# Patient Record
Sex: Female | Born: 1965 | Race: White | Hispanic: No | Marital: Married | Smoking: Never smoker
Health system: Southern US, Community
[De-identification: ages and names within clinical notes are randomized; demographics above are authoritative.]

## PROBLEM LIST (undated history)

## (undated) DIAGNOSIS — F329 Major depressive disorder, single episode, unspecified: Secondary | ICD-10-CM

## (undated) DIAGNOSIS — F32A Depression, unspecified: Secondary | ICD-10-CM

## (undated) DIAGNOSIS — O139 Gestational [pregnancy-induced] hypertension without significant proteinuria, unspecified trimester: Secondary | ICD-10-CM

## (undated) DIAGNOSIS — E559 Vitamin D deficiency, unspecified: Secondary | ICD-10-CM

## (undated) HISTORY — DX: Depression, unspecified: F32.A

## (undated) HISTORY — PX: TUBAL LIGATION: SHX77

## (undated) HISTORY — DX: Vitamin D deficiency, unspecified: E55.9

## (undated) HISTORY — DX: Gestational (pregnancy-induced) hypertension without significant proteinuria, unspecified trimester: O13.9

## (undated) HISTORY — DX: Major depressive disorder, single episode, unspecified: F32.9

## (undated) HISTORY — PX: APPENDECTOMY: SHX54

---

## 2000-03-25 ENCOUNTER — Encounter: Payer: Self-pay | Admitting: Obstetrics and Gynecology

## 2000-03-25 ENCOUNTER — Ambulatory Visit (HOSPITAL_COMMUNITY): Admission: RE | Admit: 2000-03-25 | Discharge: 2000-03-25 | Payer: Self-pay | Admitting: Obstetrics and Gynecology

## 2000-09-06 ENCOUNTER — Inpatient Hospital Stay (HOSPITAL_COMMUNITY): Admission: AD | Admit: 2000-09-06 | Discharge: 2000-09-10 | Payer: Self-pay | Admitting: Obstetrics and Gynecology

## 2000-09-11 ENCOUNTER — Encounter: Admission: RE | Admit: 2000-09-11 | Discharge: 2000-10-11 | Payer: Self-pay | Admitting: Obstetrics and Gynecology

## 2000-09-12 ENCOUNTER — Observation Stay (HOSPITAL_COMMUNITY): Admission: AD | Admit: 2000-09-12 | Discharge: 2000-09-13 | Payer: Self-pay | Admitting: Obstetrics & Gynecology

## 2000-10-18 ENCOUNTER — Other Ambulatory Visit: Admission: RE | Admit: 2000-10-18 | Discharge: 2000-10-18 | Payer: Self-pay | Admitting: Obstetrics and Gynecology

## 2001-10-11 ENCOUNTER — Other Ambulatory Visit: Admission: RE | Admit: 2001-10-11 | Discharge: 2001-10-11 | Payer: Self-pay | Admitting: Obstetrics and Gynecology

## 2003-05-20 ENCOUNTER — Other Ambulatory Visit: Admission: RE | Admit: 2003-05-20 | Discharge: 2003-05-20 | Payer: Self-pay | Admitting: Obstetrics and Gynecology

## 2004-02-12 ENCOUNTER — Emergency Department (HOSPITAL_COMMUNITY): Admission: EM | Admit: 2004-02-12 | Discharge: 2004-02-12 | Payer: Self-pay | Admitting: Emergency Medicine

## 2004-02-15 ENCOUNTER — Emergency Department (HOSPITAL_COMMUNITY): Admission: EM | Admit: 2004-02-15 | Discharge: 2004-02-15 | Payer: Self-pay | Admitting: Emergency Medicine

## 2004-02-19 ENCOUNTER — Encounter (HOSPITAL_COMMUNITY): Admission: RE | Admit: 2004-02-19 | Discharge: 2004-03-13 | Payer: Self-pay | Admitting: Family Medicine

## 2005-03-05 ENCOUNTER — Other Ambulatory Visit: Admission: RE | Admit: 2005-03-05 | Discharge: 2005-03-05 | Payer: Self-pay | Admitting: Obstetrics and Gynecology

## 2005-03-30 ENCOUNTER — Encounter: Admission: RE | Admit: 2005-03-30 | Discharge: 2005-06-20 | Payer: Self-pay | Admitting: Obstetrics and Gynecology

## 2005-05-24 ENCOUNTER — Ambulatory Visit (HOSPITAL_COMMUNITY): Admission: RE | Admit: 2005-05-24 | Discharge: 2005-05-24 | Payer: Self-pay | Admitting: Obstetrics and Gynecology

## 2005-06-25 ENCOUNTER — Ambulatory Visit (HOSPITAL_COMMUNITY): Admission: RE | Admit: 2005-06-25 | Discharge: 2005-06-25 | Payer: Self-pay | Admitting: Obstetrics and Gynecology

## 2005-11-01 ENCOUNTER — Inpatient Hospital Stay (HOSPITAL_COMMUNITY): Admission: AD | Admit: 2005-11-01 | Discharge: 2005-11-04 | Payer: Self-pay | Admitting: Obstetrics and Gynecology

## 2005-11-05 ENCOUNTER — Encounter: Admission: RE | Admit: 2005-11-05 | Discharge: 2005-12-05 | Payer: Self-pay | Admitting: Obstetrics and Gynecology

## 2005-12-06 ENCOUNTER — Encounter: Admission: RE | Admit: 2005-12-06 | Discharge: 2005-12-31 | Payer: Self-pay | Admitting: Obstetrics and Gynecology

## 2006-03-10 ENCOUNTER — Other Ambulatory Visit: Admission: RE | Admit: 2006-03-10 | Discharge: 2006-03-10 | Payer: Self-pay | Admitting: Obstetrics and Gynecology

## 2006-06-27 ENCOUNTER — Encounter: Admission: RE | Admit: 2006-06-27 | Discharge: 2006-06-27 | Payer: Self-pay | Admitting: Obstetrics and Gynecology

## 2011-01-08 ENCOUNTER — Other Ambulatory Visit: Payer: Self-pay | Admitting: Obstetrics and Gynecology

## 2011-01-08 DIAGNOSIS — Z1231 Encounter for screening mammogram for malignant neoplasm of breast: Secondary | ICD-10-CM

## 2011-01-15 ENCOUNTER — Ambulatory Visit
Admission: RE | Admit: 2011-01-15 | Discharge: 2011-01-15 | Disposition: A | Discharge status: Home / Self Care | Payer: 59 | Source: Ambulatory Visit | Attending: Obstetrics and Gynecology | Admitting: Obstetrics and Gynecology

## 2011-01-15 DIAGNOSIS — Z1231 Encounter for screening mammogram for malignant neoplasm of breast: Secondary | ICD-10-CM

## 2011-02-09 ENCOUNTER — Other Ambulatory Visit: Payer: Self-pay | Admitting: Gastroenterology

## 2011-02-19 ENCOUNTER — Ambulatory Visit
Admission: RE | Admit: 2011-02-19 | Discharge: 2011-02-19 | Disposition: A | Discharge status: Home / Self Care | Payer: 59 | Source: Ambulatory Visit | Attending: Gastroenterology | Admitting: Gastroenterology

## 2011-08-03 ENCOUNTER — Encounter: Payer: Self-pay | Admitting: Family Medicine

## 2011-08-03 ENCOUNTER — Other Ambulatory Visit: Payer: Self-pay | Admitting: Family Medicine

## 2011-08-03 ENCOUNTER — Ambulatory Visit: Payer: 59

## 2011-08-03 ENCOUNTER — Ambulatory Visit (INDEPENDENT_AMBULATORY_CARE_PROVIDER_SITE_OTHER): Payer: 59 | Admitting: Family Medicine

## 2011-08-03 VITALS — BP 122/84 | HR 75 | Temp 97.9°F | Resp 16 | Ht 66.0 in | Wt 246.0 lb

## 2011-08-03 DIAGNOSIS — R0789 Other chest pain: Secondary | ICD-10-CM

## 2011-08-03 DIAGNOSIS — R079 Chest pain, unspecified: Secondary | ICD-10-CM

## 2011-08-03 DIAGNOSIS — E669 Obesity, unspecified: Secondary | ICD-10-CM

## 2011-08-03 LAB — POCT CBC
Granulocyte percent: 60.2 %G (ref 37–80)
Hemoglobin: 12 g/dL — AB (ref 12.2–16.2)
Lymph, poc: 2.1 (ref 0.6–3.4)
MCHC: 31.7 g/dL — AB (ref 31.8–35.4)
MPV: 9.1 fL (ref 0–99.8)
POC Granulocyte: 3.9 (ref 2–6.9)
POC MID %: 7.6 %M (ref 0–12)
RBC: 4.37 M/uL (ref 4.04–5.48)

## 2011-08-03 LAB — COMPREHENSIVE METABOLIC PANEL
ALT: 15 U/L (ref 0–35)
Alkaline Phosphatase: 121 U/L — ABNORMAL HIGH (ref 39–117)
CO2: 27 mEq/L (ref 19–32)
Potassium: 4 mEq/L (ref 3.5–5.3)
Sodium: 140 mEq/L (ref 135–145)
Total Bilirubin: 0.3 mg/dL (ref 0.3–1.2)
Total Protein: 7.1 g/dL (ref 6.0–8.3)

## 2011-08-03 LAB — LIPID PANEL
Cholesterol: 217 mg/dL — ABNORMAL HIGH (ref 0–200)
LDL Cholesterol: 138 mg/dL — ABNORMAL HIGH (ref 0–99)
VLDL: 30 mg/dL (ref 0–40)

## 2011-08-03 NOTE — Progress Notes (Signed)
  Subjective:    Patient ID: Susan Meza, female    DOB: Sep 28, 1965, 46 y.o.   MRN: [id number]  HPI patient comes in with history of having had an episode Saturday night when she was at the movies pain in her low chest anteriorly. It was not a typical pain for her. Had not been eating a thing. She took a Scientist, research (medical) and symptoms seemed to subside they went to supper afterwards. She had more discomfort so she went on home rather than going out to another restaurant for dessert as they had planned. That is not like her. She did take some more TUMS without relief. She did okay on Sunday and Monday. This morning she when she got up she had more symptoms and she decided it was wise to come on and get checked out. She has lately been having some pain in her jaw and some pain through to the back. No nausea or vomiting no diaphoresis. Her bowels act fairly normally.  She does have a history of dysphagia which was treated by esophageal dilatation and has done well for the 6 months or so since then. At that time she was placed on Nexium, but now she only takes it intermittently because she really doesn't feel she needs it.  There is a strong family history of heart disease. Her father and all his side of the family had heart problems, many dying in their 80s. Her father is deceased. Her mother had heart surgery but that was for a valvular problem.  She does not get a lot of regular exercise though she plays tennis about once a week. 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    Review of Systems otherwise unremarkable.     Objective:   Physical Exam overweight white female alert oriented does not look ill. Her throat was clear. Neck supple without nodes. Chest clear to auscultation. Heart regular without murmurs gallops arrhythmias. Abdomen was soft without masses. Minimal epigastric tendernes.  Results for orders placed in visit on 08/03/11  POCT CBC      Component Value Range   WBC 6.4  4.6 -  10.2 (K/uL)   Lymph, poc 2.1  0.6 - 3.4    POC LYMPH PERCENT 32.2  10 - 50 (%L)   MID (cbc) 0.5  0 - 0.9    POC MID % 7.6  0 - 12 (%M)   POC Granulocyte 3.9  2 - 6.9    Granulocyte percent 60.2  37 - 80 (%G)   RBC 4.37  4.04 - 5.48 (M/uL)   Hemoglobin 12.0 (*) 12.2 - 16.2 (g/dL)   HCT, POC 82.9  56.2 - 47.9 (%)   MCV 86.5  80 - 97 (fL)   MCH, POC 27.5  27 - 31.2 (pg)   MCHC 31.7 (*) 31.8 - 35.4 (g/dL)   RDW, POC 13.0     Platelet Count, POC 257  142 - 424 (K/uL)   MPV 9.1  0 - 99.8 (fL)     ekg normal      Assessment & Plan:  Atypical chest pain Family history coronary artery disease History of esophageal stricture, resolved  Will check EKG and chest x-ray and a couple of basic labs and proceed from there.

## 2011-08-03 NOTE — Patient Instructions (Signed)
The Nexium she already has. Followup if symptoms persist. I will let her mother rest of her labs.

## 2011-08-03 NOTE — Progress Notes (Signed)
  Subjective:    Patient ID: Susan Meza, female    DOB: 01/22/1966, 46 y.o.   MRN: [id number]  HPI    Review of Systems     Objective:   Physical Exam   [facility] reading (PRIMARY) by  Dr. Alwyn Ren Chest x-ray was viewed and was negative.      Assessment & Plan:

## 2011-08-06 ENCOUNTER — Encounter: Payer: Self-pay | Admitting: Family Medicine

## 2011-08-13 ENCOUNTER — Encounter: Payer: Self-pay | Admitting: Family Medicine

## 2011-12-21 DIAGNOSIS — O139 Gestational [pregnancy-induced] hypertension without significant proteinuria, unspecified trimester: Secondary | ICD-10-CM | POA: Insufficient documentation

## 2011-12-28 ENCOUNTER — Ambulatory Visit: Payer: Self-pay | Admitting: Obstetrics and Gynecology

## 2012-05-09 ENCOUNTER — Other Ambulatory Visit: Payer: Self-pay | Admitting: Obstetrics and Gynecology

## 2012-05-09 DIAGNOSIS — Z1231 Encounter for screening mammogram for malignant neoplasm of breast: Secondary | ICD-10-CM

## 2012-06-09 ENCOUNTER — Ambulatory Visit
Admission: RE | Admit: 2012-06-09 | Discharge: 2012-06-09 | Disposition: A | Discharge status: Home / Self Care | Payer: 59 | Source: Ambulatory Visit | Attending: Obstetrics and Gynecology | Admitting: Obstetrics and Gynecology

## 2012-06-09 DIAGNOSIS — Z1231 Encounter for screening mammogram for malignant neoplasm of breast: Secondary | ICD-10-CM

## 2014-10-31 ENCOUNTER — Other Ambulatory Visit: Payer: Self-pay | Admitting: Registered Nurse

## 2014-10-31 ENCOUNTER — Other Ambulatory Visit (HOSPITAL_COMMUNITY)
Admission: RE | Admit: 2014-10-31 | Discharge: 2014-10-31 | Disposition: A | Discharge status: Home / Self Care | Payer: Managed Care, Other (non HMO) | Source: Ambulatory Visit | Attending: Internal Medicine | Admitting: Internal Medicine

## 2014-10-31 DIAGNOSIS — Z01419 Encounter for gynecological examination (general) (routine) without abnormal findings: Secondary | ICD-10-CM | POA: Insufficient documentation

## 2014-11-05 LAB — CYTOLOGY - PAP

## 2017-05-23 ENCOUNTER — Other Ambulatory Visit: Payer: Self-pay | Admitting: Registered Nurse

## 2017-05-23 DIAGNOSIS — Z1231 Encounter for screening mammogram for malignant neoplasm of breast: Secondary | ICD-10-CM

## 2017-06-23 ENCOUNTER — Ambulatory Visit
Admission: RE | Admit: 2017-06-23 | Discharge: 2017-06-23 | Disposition: A | Discharge status: Home / Self Care | Payer: Managed Care, Other (non HMO) | Source: Ambulatory Visit | Attending: Registered Nurse | Admitting: Registered Nurse

## 2017-06-23 DIAGNOSIS — Z1231 Encounter for screening mammogram for malignant neoplasm of breast: Secondary | ICD-10-CM

## 2020-06-06 ENCOUNTER — Other Ambulatory Visit: Payer: Self-pay | Admitting: Registered Nurse

## 2020-06-06 DIAGNOSIS — Z1231 Encounter for screening mammogram for malignant neoplasm of breast: Secondary | ICD-10-CM

## 2020-07-18 ENCOUNTER — Ambulatory Visit: Payer: Managed Care, Other (non HMO)

## 2020-08-29 ENCOUNTER — Other Ambulatory Visit: Payer: Self-pay

## 2020-08-29 ENCOUNTER — Ambulatory Visit
Admission: RE | Admit: 2020-08-29 | Discharge: 2020-08-29 | Disposition: A | Discharge status: Home / Self Care | Payer: Self-pay | Source: Ambulatory Visit | Attending: Registered Nurse | Admitting: Registered Nurse

## 2020-08-29 DIAGNOSIS — Z1231 Encounter for screening mammogram for malignant neoplasm of breast: Secondary | ICD-10-CM

## 2020-09-02 ENCOUNTER — Other Ambulatory Visit: Payer: Self-pay | Admitting: Internal Medicine

## 2020-09-02 DIAGNOSIS — R748 Abnormal levels of other serum enzymes: Secondary | ICD-10-CM

## 2020-09-08 ENCOUNTER — Ambulatory Visit
Admission: RE | Admit: 2020-09-08 | Discharge: 2020-09-08 | Disposition: A | Discharge status: Home / Self Care | Payer: Managed Care, Other (non HMO) | Source: Ambulatory Visit | Attending: Internal Medicine | Admitting: Internal Medicine

## 2020-09-08 DIAGNOSIS — R748 Abnormal levels of other serum enzymes: Secondary | ICD-10-CM

## 2021-06-09 ENCOUNTER — Other Ambulatory Visit: Payer: Self-pay | Admitting: Registered Nurse

## 2021-06-09 DIAGNOSIS — Z8249 Family history of ischemic heart disease and other diseases of the circulatory system: Secondary | ICD-10-CM

## 2021-07-07 ENCOUNTER — Ambulatory Visit
Admission: RE | Admit: 2021-07-07 | Discharge: 2021-07-07 | Disposition: A | Discharge status: Home / Self Care | Payer: No Typology Code available for payment source | Source: Ambulatory Visit | Attending: Registered Nurse | Admitting: Registered Nurse

## 2021-07-07 DIAGNOSIS — Z8249 Family history of ischemic heart disease and other diseases of the circulatory system: Secondary | ICD-10-CM

## 2021-07-15 ENCOUNTER — Other Ambulatory Visit: Payer: Self-pay | Admitting: Registered Nurse

## 2021-07-15 DIAGNOSIS — M89319 Hypertrophy of bone, unspecified shoulder: Secondary | ICD-10-CM

## 2021-07-21 ENCOUNTER — Other Ambulatory Visit: Payer: Managed Care, Other (non HMO)

## 2021-07-22 ENCOUNTER — Ambulatory Visit
Admission: RE | Admit: 2021-07-22 | Discharge: 2021-07-22 | Disposition: A | Discharge status: Home / Self Care | Payer: Managed Care, Other (non HMO) | Source: Ambulatory Visit | Attending: Registered Nurse | Admitting: Registered Nurse

## 2021-07-22 DIAGNOSIS — M89319 Hypertrophy of bone, unspecified shoulder: Secondary | ICD-10-CM

## 2021-07-28 ENCOUNTER — Telehealth: Payer: Self-pay | Admitting: Hematology

## 2021-07-28 NOTE — Telephone Encounter (Signed)
Scheduled appt per 2/6 referral. Pt is aware of appt date and time. Pt is aware to arrive 15 mins prior to appt time.  °

## 2021-08-19 ENCOUNTER — Other Ambulatory Visit: Payer: Self-pay

## 2021-08-19 ENCOUNTER — Encounter: Payer: Self-pay | Admitting: Physician Assistant

## 2021-08-19 ENCOUNTER — Encounter: Payer: Self-pay | Admitting: Hematology

## 2021-08-19 ENCOUNTER — Inpatient Hospital Stay: Payer: Managed Care, Other (non HMO) | Attending: Hematology | Admitting: Physician Assistant

## 2021-08-19 ENCOUNTER — Inpatient Hospital Stay: Payer: Managed Care, Other (non HMO)

## 2021-08-19 VITALS — BP 111/85 | HR 74 | Temp 97.3°F | Resp 20 | Wt 213.5 lb

## 2021-08-19 DIAGNOSIS — Z79899 Other long term (current) drug therapy: Secondary | ICD-10-CM | POA: Insufficient documentation

## 2021-08-19 DIAGNOSIS — F329 Major depressive disorder, single episode, unspecified: Secondary | ICD-10-CM | POA: Insufficient documentation

## 2021-08-19 DIAGNOSIS — R7982 Elevated C-reactive protein (CRP): Secondary | ICD-10-CM | POA: Insufficient documentation

## 2021-08-19 DIAGNOSIS — G4733 Obstructive sleep apnea (adult) (pediatric): Secondary | ICD-10-CM | POA: Insufficient documentation

## 2021-08-19 DIAGNOSIS — R7989 Other specified abnormal findings of blood chemistry: Secondary | ICD-10-CM | POA: Diagnosis not present

## 2021-08-19 DIAGNOSIS — F419 Anxiety disorder, unspecified: Secondary | ICD-10-CM | POA: Diagnosis not present

## 2021-08-19 DIAGNOSIS — G8929 Other chronic pain: Secondary | ICD-10-CM | POA: Insufficient documentation

## 2021-08-19 DIAGNOSIS — M25561 Pain in right knee: Secondary | ICD-10-CM | POA: Diagnosis not present

## 2021-08-19 LAB — CBC WITH DIFFERENTIAL (CANCER CENTER ONLY)
Abs Immature Granulocytes: 0.02 10*3/uL (ref 0.00–0.07)
Basophils Absolute: 0 10*3/uL (ref 0.0–0.1)
Basophils Relative: 0 %
Eosinophils Absolute: 0.3 10*3/uL (ref 0.0–0.5)
Eosinophils Relative: 4 %
HCT: 37.4 % (ref 36.0–46.0)
Hemoglobin: 12.8 g/dL (ref 12.0–15.0)
Immature Granulocytes: 0 %
Lymphocytes Relative: 28 %
Lymphs Abs: 2 10*3/uL (ref 0.7–4.0)
MCH: 29.4 pg (ref 26.0–34.0)
MCHC: 34.2 g/dL (ref 30.0–36.0)
MCV: 86 fL (ref 80.0–100.0)
Monocytes Absolute: 0.5 10*3/uL (ref 0.1–1.0)
Monocytes Relative: 7 %
Neutro Abs: 4.3 10*3/uL (ref 1.7–7.7)
Neutrophils Relative %: 61 %
Platelet Count: 318 10*3/uL (ref 150–400)
RBC: 4.35 MIL/uL (ref 3.87–5.11)
RDW: 12.8 % (ref 11.5–15.5)
WBC Count: 7 10*3/uL (ref 4.0–10.5)
nRBC: 0 % (ref 0.0–0.2)

## 2021-08-19 LAB — CMP (CANCER CENTER ONLY)
ALT: 14 U/L (ref 0–44)
AST: 14 U/L — ABNORMAL LOW (ref 15–41)
Albumin: 4.3 g/dL (ref 3.5–5.0)
Alkaline Phosphatase: 125 U/L (ref 38–126)
Anion gap: 5 (ref 5–15)
BUN: 10 mg/dL (ref 6–20)
CO2: 30 mmol/L (ref 22–32)
Calcium: 9.6 mg/dL (ref 8.9–10.3)
Chloride: 105 mmol/L (ref 98–111)
Creatinine: 0.95 mg/dL (ref 0.44–1.00)
GFR, Estimated: >60 mL/min (ref >60)
Glucose, Bld: 96 mg/dL (ref 70–99)
Potassium: 4 mmol/L (ref 3.5–5.1)
Sodium: 140 mmol/L (ref 135–145)
Total Bilirubin: 0.4 mg/dL (ref 0.3–1.2)
Total Protein: 7.6 g/dL (ref 6.5–8.1)

## 2021-08-19 LAB — C-REACTIVE PROTEIN: CRP: 2.1 mg/dL — ABNORMAL HIGH (ref <1.0)

## 2021-08-19 LAB — D-DIMER, QUANTITATIVE: D-Dimer, Quant: 1.94 ug/mL-FEU — ABNORMAL HIGH (ref 0.00–0.50)

## 2021-08-19 LAB — SEDIMENTATION RATE: Sed Rate: 23 mm/hr — ABNORMAL HIGH (ref 0–22)

## 2021-08-19 NOTE — Progress Notes (Signed)
[place] [phone]   Fax:(336) [place] NOTE  Patient Care Team: Deland Pretty, MD as PCP - General (Internal Medicine)  Hematological/Oncological History 1) 07/10/2021: Patient presented to the emergency room at [facility] during vacation due to altered mental status she had 4 tequila drinks and had taken several of her prescribed medications including Mounjaro injection.  Work-up included CT head and CT a of the chest that were both unremarkable.  Blood work revealed elevated D-dimer level and potassium level of 2.7.  2) Labs from PCP, Dr. Deland Pretty: -07/15/2021: WBC 7.3 K/uL, Hgb 14.7 gm/dL, Plt 337K, Alkt phos 159 IU/K (H), ALT 27 IU/L, AST 19 IU/L.  -07/16/2021: Elevated C-reactive protein measuring 1.69 mg/dL -07/18/2021: Elevated D-dimer measuring 2.32 ug/mL.   3) 08/19/2021: Establish care with [facility]  CHIEF COMPLAINTS/PURPOSE OF CONSULTATION:  "Elevated D-Dimer "  HISTORY OF PRESENTING ILLNESS:  Susan Meza 56 y.o. female with medical history significant for anxiety with depression, obstructive sleep apnea and history of HELLP syndrome during pregnancy.  Patient is unaccompanied for this visit.  On exam today,Ms. Licona reports stable energy levels she is able to complete her daily activities on her own.  She has a good appetite denies any recent weight changes.  Patient reports diffuse body pain that has been present for over 6 months with back and neck spasms.  She denies any nausea, vomiting or abdominal pain.  Her bowel habits are regular without any diarrhea or constipation.  Patient denies easy bruising or signs of active bleeding.  She reports firmness behind her right knee with pain that has been present for some time.  She denies any swelling in her lower extremities.  Patient denies fevers, chills, night sweats, shortness of breath, chest pain or cough.  She has no other complaints.  Rest of the  10 point ROS is below.  MEDICAL HISTORY:  Past Medical History:  Diagnosis Date   Depression    Pregnancy induced hypertension    HELLP   Vitamin D deficiency     SURGICAL HISTORY: Past Surgical History:  Procedure Laterality Date   APPENDECTOMY     CESAREAN SECTION     x 2   TUBAL LIGATION      SOCIAL HISTORY: Social History   Socioeconomic History   Marital status: Married    Spouse name: Not on file   Number of children: Not on file   Years of education: Not on file   Highest education level: Not on file  Occupational History   Not on file  Tobacco Use   Smoking status: Never   Smokeless tobacco: Never  Substance and Sexual Activity   Alcohol use: Yes    Alcohol/week: 4.0 standard drinks    Types: 4 drink(s) per week   Drug use: No   Sexual activity: Not on file  Other Topics Concern   Not on file  Social History Narrative   Not on file   Social Determinants of Health   Financial Resource Strain: Not on file  Food Insecurity: Not on file  Transportation Needs: Not on file  Physical Activity: Not on file  Stress: Not on file  Social Connections: Not on file  Intimate Partner Violence: Not on file    FAMILY HISTORY: Family History  Problem Relation Age of Onset   Stroke Mother 22   Cancer Mother        SKIN   Hypertension Father    Diabetes Father  Cancer Sister        SKIN   Depression Brother    Heart disease Maternal Grandmother    Stroke Maternal Grandmother    Heart disease Paternal Grandfather    Diabetes Paternal Grandfather    Thyroid disease Cousin     ALLERGIES:  is allergic to percodan [oxycodone-aspirin].  MEDICATIONS:  Current Outpatient Medications  Medication Sig Dispense Refill   ALPRAZolam (XANAX) 0.25 MG tablet Take 0.25 mg by mouth 2 (two) times daily as needed.     atorvastatin (LIPITOR) 40 MG tablet      buPROPion (WELLBUTRIN XL) 300 MG 24 hr tablet Take by mouth.     Cholecalciferol (VITAMIN D) 50 MCG (2000 UT)  CAPS 2 capsules     Cyanocobalamin (VITAMIN B 12) 500 MCG TABS 1 tablet     DEXILANT 60 MG capsule Take by mouth.     ferrous sulfate 325 (65 FE) MG tablet 1 tablet     FLUoxetine (PROZAC) 20 MG capsule Take by mouth.     zolpidem (AMBIEN) 5 MG tablet Take by mouth.     buPROPion (WELLBUTRIN SR) 150 MG 12 hr tablet Take 150 mg by mouth 2 (two) times daily. (Patient not taking: Reported on 08/19/2021)     esomeprazole (NEXIUM) 40 MG capsule Take 40 mg by mouth daily before breakfast. (Patient not taking: Reported on 08/19/2021)     FLUoxetine (PROZAC) 10 MG capsule Take 10 mg by mouth 3 (three) times daily. (Patient not taking: Reported on 08/19/2021)     No current facility-administered medications for this visit.    REVIEW OF SYSTEMS:   Constitutional: ( - ) fevers, ( - )  chills , ( - ) night sweats Eyes: ( - ) blurriness of vision, ( - ) double vision, ( - ) watery eyes Ears, nose, mouth, throat, and face: ( - ) mucositis, ( - ) sore throat Respiratory: ( - ) cough, ( - ) dyspnea, ( - ) wheezes Cardiovascular: ( - ) palpitation, ( - ) chest discomfort, ( - ) lower extremity swelling Gastrointestinal:  ( - ) nausea, ( - ) heartburn, ( - ) change in bowel habits Skin: ( - ) abnormal skin rashes Lymphatics: ( - ) new lymphadenopathy, ( - ) easy bruising Neurological: ( - ) numbness, ( - ) tingling, ( - ) new weaknesses Behavioral/Psych: ( - ) mood change, ( - ) new changes  All other systems were reviewed with the patient and are negative.  PHYSICAL EXAMINATION: ECOG PERFORMANCE STATUS: 1 - Symptomatic but completely ambulatory  Vitals:   08/19/21 1135  BP: 111/85  Pulse: 74  Resp: 20  Temp: (!) 97.3 F (36.3 C)  SpO2: 100%   Filed Weights   08/19/21 1135  Weight: 213 lb 8 oz (96.8 kg)    GENERAL: well appearing female in NAD  SKIN: skin color, texture, turgor are normal, no rashes or significant lesions EYES: conjunctiva are pink and non-injected, sclera clear OROPHARYNX: no  exudate, no erythema; lips, buccal mucosa, and tongue normal  NECK: supple, non-tender LYMPH:  no palpable lymphadenopathy in the cervical, axillary or supraclavicular lymph nodes.  LUNGS: clear to auscultation and percussion with normal breathing effort HEART: regular rate & rhythm and no murmurs and no lower extremity edema. Palpable, firm nodularity behind right knee that is nontender ABDOMEN: soft, non-tender, non-distended, normal bowel sounds Musculoskeletal: no cyanosis of digits and no clubbing  PSYCH: alert & oriented x 3, fluent speech NEURO: no  focal motor/sensory deficits  LABORATORY DATA:  I have reviewed the data as listed CBC Latest Ref Rng & Units 08/19/2021 08/03/2011  WBC 4.0 - 10.5 K/uL 7.0 6.4  Hemoglobin 12.0 - 15.0 g/dL 12.8 12.0(A)  Hematocrit 36.0 - 46.0 % 37.4 37.8  Platelets 150 - 400 K/uL 318 -    CMP Latest Ref Rng & Units 08/19/2021 08/03/2011  Glucose 70 - 99 mg/dL 96 93  BUN 6 - 20 mg/dL 10 14  Creatinine 0.44 - 1.00 mg/dL 0.95 0.87  Sodium 135 - 145 mmol/L 140 140  Potassium 3.5 - 5.1 mmol/L 4.0 4.0  Chloride 98 - 111 mmol/L 105 104  CO2 22 - 32 mmol/L 30 27  Calcium 8.9 - 10.3 mg/dL 9.6 8.9  Total Protein 6.5 - 8.1 g/dL 7.6 7.1  Total Bilirubin 0.3 - 1.2 mg/dL 0.4 0.3  Alkaline Phos 38 - 126 U/L 125 121(H)  AST 15 - 41 U/L 14(L) 14  ALT 0 - 44 U/L 14 15    RADIOGRAPHIC STUDIES: I have personally reviewed the radiological images as listed and agreed with the findings in the report. US SOFT TISSUE HEAD & NECK (NON-THYROID)  Result Date: 07/22/2021 CLINICAL DATA:  Right supraclavicular enlargement EXAM: ULTRASOUND OF HEAD/NECK SOFT TISSUES TECHNIQUE: Ultrasound examination of the head and neck soft tissues was performed in the area of clinical concern. COMPARISON:  None. FINDINGS: Sonographic interrogation of the right supraclavicular fossa demonstrates normal subcutaneous adipose tissue. No lymphadenopathy or focal mass lesion. IMPRESSION: Negative  sonographic survey of the region of clinical concern. Electronically Signed   By: Jacqulynn Cadet M.D.   On: 07/22/2021 15:00    ASSESSMENT & PLAN LAVREN CHIAPPONE is a 56 y.o. female who presents to the clinic for initial evaluation for elevated D-dimer levels.  We reviewed possible etiologies for elevated D-dimer level including inflammatory process, infection, trauma, pregnancy, thrombosis and heart disease.  Patient has no history of blood clots and outside CT imaging from 07/10/2021 showed no evidence of a pulmonary embolism.  Patient will proceed with serologic work-up today to check CBC, CMP, inflammatory markers and repeat D-dimer level.  Due to palpable nodularity behind the right knee, we will obtain a Doppler ultrasound of her lower extremities to rule out a DVT.   #Elevated D-dimer levels: --Etiology unknown but most recent level was mild which is reassuring.  --CTA chest from 07/10/2021 was negative for PE --Labs today to check CBC, CMP, CRP, Sed rate and D-dimer --Will need doppler US of lower extremity to rule out DVT --RTC based on above workup   Orders Placed This Encounter  Procedures   CBC with Differential ([place] Only)    Standing Status:   Future    Number of Occurrences:   1    Standing Expiration Date:   02/19/2022   CMP ([place] only)    Standing Status:   Future    Number of Occurrences:   1    Standing Expiration Date:   02/19/2022   Sedimentation rate    Standing Status:   Future    Number of Occurrences:   1    Standing Expiration Date:   02/19/2022   C-reactive protein    Standing Status:   Future    Number of Occurrences:   1    Standing Expiration Date:   02/19/2022   D-dimer, quantitative    Standing Status:   Future    Number of Occurrences:   1  Standing Expiration Date:   02/19/2022    All questions were answered. The patient knows to call the clinic with any problems, questions or concerns.  I have spent a total of 60 minutes  minutes of face-to-face and non-face-to-face time, preparing to see the [place] a medically appropriate examination, counseling and educating the patient, ordering tests/procedures, documenting clinical information in the electronic health record, and care coordination.   Dede Query, PA-C Department of Hematology/Oncology [place] at [facility] Phone: [phone]  Patient was seen with Dr. Lorenso Courier  I have read the above note and personally examined the patient. I agree with the assessment and plan as noted above.  Briefly Ms. Tonja Medlen is a 56 year old female who presents for evaluation of elevated D-dimer in the absence of DVT/PE.  She was seen in the emergency department after a syncopal episode and D-dimer was ordered.  At the time it was found to be elevated (exact value was unclear based on outside reports, but appeared to be elevated above reference range).  D-dimer was subsequently repeated by the patient's primary care provider and found to be 2.32.  Due to concern for persistently elevated D-dimer referral was placed to hematology.  At this time we have a low suspicion that this is of clinical significance.  D-dimer in the absence of a DVT or PE is not predictive of future clot risk for evidence of underlying hematological disorder.  D-dimer marker is remarkably nonspecific and can be altered by a number of factors including inflammation, acute illness, and even pregnancy.  We will repeat the D-dimer testing today as well as inflammatory markers and baseline CBC CMP.  There is no need for routine follow-up in our clinic unless there were to be any concerning abnormalities in the above blood work.   Ledell Peoples, MD Department of Hematology/Oncology [place] at [facility] Phone: [phone] Pager: [phone] Email: [email or web address]

## 2021-08-20 DIAGNOSIS — R7989 Other specified abnormal findings of blood chemistry: Secondary | ICD-10-CM | POA: Insufficient documentation

## 2021-08-26 ENCOUNTER — Other Ambulatory Visit: Payer: Self-pay

## 2021-08-26 ENCOUNTER — Ambulatory Visit (HOSPITAL_COMMUNITY)
Admission: RE | Admit: 2021-08-26 | Discharge: 2021-08-26 | Disposition: A | Discharge status: Home / Self Care | Payer: Managed Care, Other (non HMO) | Source: Ambulatory Visit | Attending: Physician Assistant | Admitting: Physician Assistant

## 2021-08-26 DIAGNOSIS — R7989 Other specified abnormal findings of blood chemistry: Secondary | ICD-10-CM | POA: Insufficient documentation

## 2021-08-26 DIAGNOSIS — G8929 Other chronic pain: Secondary | ICD-10-CM | POA: Diagnosis present

## 2021-08-26 DIAGNOSIS — M25561 Pain in right knee: Secondary | ICD-10-CM | POA: Diagnosis present

## 2021-08-26 NOTE — Progress Notes (Signed)
Bilateral lower extremity venous duplex has been completed. ?Preliminary results can be found in CV Proc through chart review.  ?Results were given to Georga Kaufmann PA. ? ?08/26/21 1:29 PM ?Olen Cordial RVT   ?

## 2021-08-27 ENCOUNTER — Telehealth: Payer: Self-pay | Admitting: Physician Assistant

## 2021-08-27 ENCOUNTER — Telehealth: Payer: Self-pay

## 2021-08-27 DIAGNOSIS — M2550 Pain in unspecified joint: Secondary | ICD-10-CM

## 2021-08-27 NOTE — Telephone Encounter (Signed)
Please send referral to rheumatology for diffuse joint pain ? ?Referral faxed to [facility] [phone].  Confirmation rec'd. ?

## 2021-08-27 NOTE — Telephone Encounter (Signed)
I called Ms. Foody to review the doppler US results from yesterday 08/26/2021. There is no evidence of DVT in the lower extremities. Labs from 08/19/2021 showed continued improvement of D-dimer levels with very mild elevation of inflammatory markers. CBC was unremarkable. We do not recommendation any additional hematologic workup.  ? ?Patient reports having diffuse joint pain and wanted to pursue workup. I advised patient get evaluated by rheumatology for this so I will place a referral today. Patient will return to our clinic as needed.  ?

## 2021-09-15 ENCOUNTER — Ambulatory Visit: Payer: Self-pay | Admitting: Family Medicine

## 2021-09-29 ENCOUNTER — Ambulatory Visit: Payer: Self-pay | Admitting: Family Medicine
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
## 2021-10-13 ENCOUNTER — Encounter: Payer: Self-pay | Admitting: Family Medicine

## 2021-10-13 ENCOUNTER — Ambulatory Visit (INDEPENDENT_AMBULATORY_CARE_PROVIDER_SITE_OTHER): Payer: Managed Care, Other (non HMO) | Admitting: Family Medicine

## 2021-10-13 VITALS — BP 106/72 | HR 85 | Ht 66.0 in | Wt 206.0 lb

## 2021-10-13 DIAGNOSIS — M359 Systemic involvement of connective tissue, unspecified: Secondary | ICD-10-CM | POA: Insufficient documentation

## 2021-10-13 DIAGNOSIS — M797 Fibromyalgia: Secondary | ICD-10-CM | POA: Insufficient documentation

## 2021-10-13 DIAGNOSIS — D509 Iron deficiency anemia, unspecified: Secondary | ICD-10-CM | POA: Insufficient documentation

## 2021-10-13 DIAGNOSIS — R748 Abnormal levels of other serum enzymes: Secondary | ICD-10-CM | POA: Insufficient documentation

## 2021-10-13 DIAGNOSIS — F418 Other specified anxiety disorders: Secondary | ICD-10-CM | POA: Insufficient documentation

## 2021-10-13 DIAGNOSIS — Z6841 Body Mass Index (BMI) 40.0 and over, adult: Secondary | ICD-10-CM | POA: Insufficient documentation

## 2021-10-13 DIAGNOSIS — M353 Polymyalgia rheumatica: Secondary | ICD-10-CM | POA: Diagnosis not present

## 2021-10-13 DIAGNOSIS — M2550 Pain in unspecified joint: Secondary | ICD-10-CM | POA: Diagnosis not present

## 2021-10-13 LAB — URIC ACID: Uric Acid, Serum: 4.4 mg/dL (ref 2.4–7.0)

## 2021-10-13 LAB — SEDIMENTATION RATE: Sed Rate: 13 mm/hr (ref 0–30)

## 2021-10-13 LAB — IBC PANEL
Iron: 77 ug/dL (ref 42–145)
Saturation Ratios: 23.2 % (ref 20.0–50.0)
TIBC: 331.8 ug/dL (ref 250.0–450.0)
Transferrin: 237 mg/dL (ref 212.0–360.0)

## 2021-10-13 LAB — VITAMIN D 25 HYDROXY (VIT D DEFICIENCY, FRACTURES): VITD: 30.71 ng/mL (ref 30.00–100.00)

## 2021-10-13 LAB — TSH: TSH: 1.45 u[IU]/mL (ref 0.35–5.50)

## 2021-10-13 LAB — FERRITIN: Ferritin: 99.3 ng/mL (ref 10.0–291.0)

## 2021-10-13 NOTE — Patient Instructions (Addendum)
Labs today ?Do prescribed exercises at least 3x a week ?DHEA 50mg  daily for 4 weeks then 2 weeks off: to protect adrenal glands from prednisone ?Consider Asking rheumatologist if titrate less that 10mg  is necessary ?See you again in 7-8 weeks ?

## 2021-10-14 LAB — PTH, INTACT AND CALCIUM
Calcium: 9.4 mg/dL (ref 8.6–10.4)
PTH: 29 pg/mL (ref 16–77)

## 2021-10-14 LAB — RHEUMATOID FACTOR: Rheumatoid fact SerPl-aCnc: <14 IU/mL (ref <14)

## 2021-10-14 NOTE — Assessment & Plan Note (Addendum)
More likely PMR vs fibromyalgia.  ?Labs order to rule out other pathology  ?Discussed HEP, staying active.  ?Discussed DHEA due to length of being on the prednisone and titration.   ?Stay active ?rtc in 6-8 weeks and discuss  ? ?

## 2021-11-09 ENCOUNTER — Ambulatory Visit: Payer: Self-pay | Admitting: Family Medicine

## 2021-12-17 ENCOUNTER — Ambulatory Visit: Payer: Managed Care, Other (non HMO) | Admitting: Family Medicine

## 2022-04-30 ENCOUNTER — Other Ambulatory Visit: Payer: Self-pay | Admitting: Rheumatology

## 2022-04-30 DIAGNOSIS — M25562 Pain in left knee: Secondary | ICD-10-CM

## 2022-05-16 ENCOUNTER — Ambulatory Visit
Admission: RE | Admit: 2022-05-16 | Discharge: 2022-05-16 | Disposition: A | Discharge status: Home / Self Care | Payer: Managed Care, Other (non HMO) | Source: Ambulatory Visit | Attending: Rheumatology | Admitting: Rheumatology

## 2022-05-16 DIAGNOSIS — M25562 Pain in left knee: Secondary | ICD-10-CM

## 2022-11-02 ENCOUNTER — Other Ambulatory Visit (HOSPITAL_COMMUNITY): Payer: Self-pay

## 2022-11-02 MED ORDER — ZEPBOUND 12.5 MG/0.5ML ~~LOC~~ SOAJ
12.5000 mg | SUBCUTANEOUS | 0 refills | Status: AC
  Filled 2023-04-08: qty 2, 28d supply, fill #0

## 2022-11-02 MED ORDER — ZEPBOUND 10 MG/0.5ML ~~LOC~~ SOAJ
10.0000 mg | SUBCUTANEOUS | 0 refills | Status: AC
  Filled 2022-12-13 – 2023-01-26 (×2): qty 2, 28d supply, fill #0

## 2022-11-02 MED ORDER — ZEPBOUND 7.5 MG/0.5ML ~~LOC~~ SOAJ
7.5000 mg | SUBCUTANEOUS | 0 refills | Status: DC
  Filled 2022-11-02: qty 2, 28d supply, fill #0

## 2022-11-02 MED ORDER — ZEPBOUND 15 MG/0.5ML ~~LOC~~ SOAJ: 15.0000 mg | SUBCUTANEOUS | 1 refills | Status: AC

## 2022-11-04 ENCOUNTER — Other Ambulatory Visit (HOSPITAL_COMMUNITY): Payer: Self-pay

## 2022-11-11 ENCOUNTER — Other Ambulatory Visit (HOSPITAL_COMMUNITY): Payer: Self-pay

## 2022-11-11 ENCOUNTER — Other Ambulatory Visit (HOSPITAL_BASED_OUTPATIENT_CLINIC_OR_DEPARTMENT_OTHER): Payer: Self-pay

## 2022-12-13 ENCOUNTER — Other Ambulatory Visit (HOSPITAL_COMMUNITY): Payer: Self-pay

## 2022-12-13 MED ORDER — ZEPBOUND 7.5 MG/0.5ML ~~LOC~~ SOAJ
7.5000 mg | SUBCUTANEOUS | 0 refills | Status: AC
  Filled 2022-12-13: qty 2, 28d supply, fill #0

## 2023-01-26 ENCOUNTER — Other Ambulatory Visit (HOSPITAL_COMMUNITY): Payer: Self-pay

## 2023-04-08 ENCOUNTER — Other Ambulatory Visit (HOSPITAL_COMMUNITY): Payer: Self-pay

## 2023-04-25 ENCOUNTER — Other Ambulatory Visit: Payer: Self-pay | Admitting: Registered Nurse

## 2023-04-25 DIAGNOSIS — Z1231 Encounter for screening mammogram for malignant neoplasm of breast: Secondary | ICD-10-CM

## 2023-04-27 ENCOUNTER — Ambulatory Visit
Admission: RE | Admit: 2023-04-27 | Discharge: 2023-04-27 | Disposition: A | Discharge status: Home / Self Care | Payer: Managed Care, Other (non HMO) | Source: Ambulatory Visit | Attending: Registered Nurse | Admitting: Registered Nurse

## 2023-04-27 DIAGNOSIS — Z1231 Encounter for screening mammogram for malignant neoplasm of breast: Secondary | ICD-10-CM

## 2023-05-10 ENCOUNTER — Other Ambulatory Visit (HOSPITAL_COMMUNITY): Payer: Self-pay

## 2023-05-10 MED ORDER — ZEPBOUND 10 MG/0.5ML ~~LOC~~ SOAJ
10.0000 mg | SUBCUTANEOUS | 6 refills | Status: AC
  Filled 2023-05-10 – 2023-05-23 (×2): qty 2, 28d supply, fill #0
  Filled 2023-06-17 – 2023-06-30 (×3): qty 2, 28d supply, fill #1
  Filled 2023-08-20: qty 2, 28d supply, fill #2
  Filled 2023-09-21: qty 2, 28d supply, fill #3
  Filled 2023-12-02 – 2023-12-06 (×2): qty 2, 28d supply, fill #4

## 2023-05-23 ENCOUNTER — Other Ambulatory Visit (HOSPITAL_COMMUNITY): Payer: Self-pay

## 2023-06-17 ENCOUNTER — Other Ambulatory Visit (HOSPITAL_COMMUNITY): Payer: Self-pay

## 2023-06-26 IMAGING — US US SOFT TISSUE HEAD/NECK
1 series · 7 of 7 positions shown · non-contrast
Comparison: None.

CLINICAL DATA: Right supraclavicular enlargement

EXAM:
ULTRASOUND OF HEAD/NECK SOFT TISSUES
TECHNIQUE: Ultrasound examination of the head and neck soft tissues was
performed in the area of clinical concern.

[Series 1: us soft tissue head/neck · 0.06mm/px · 7 of 7 slices shown]
[im 1/7]
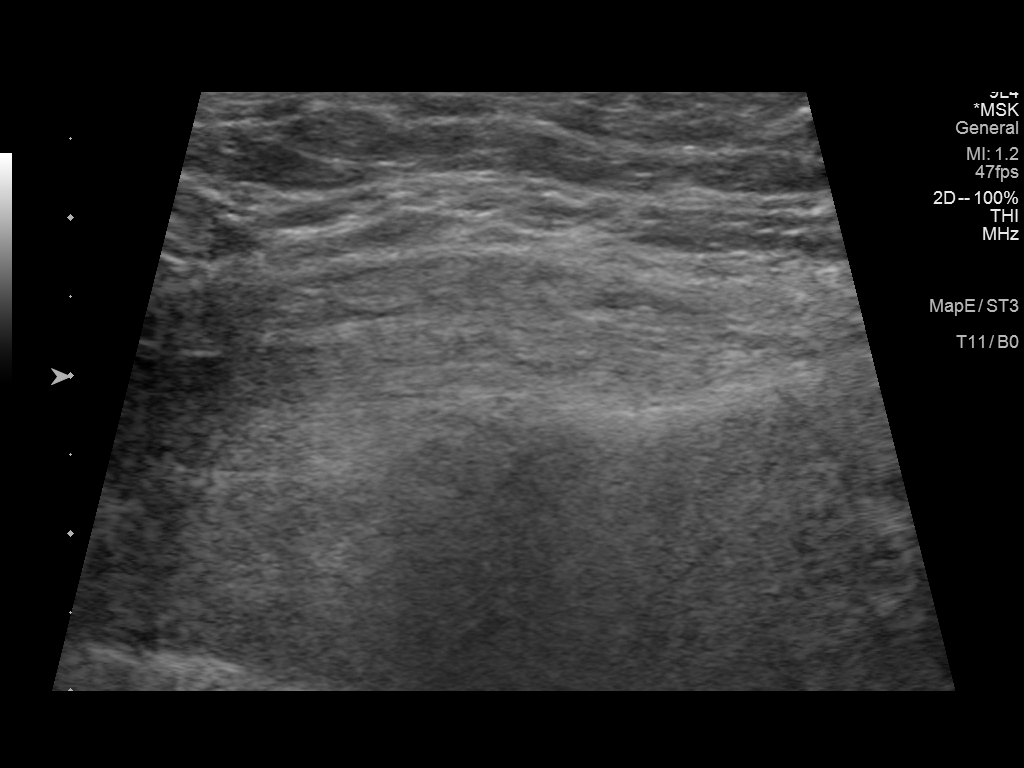
[im 2/7]
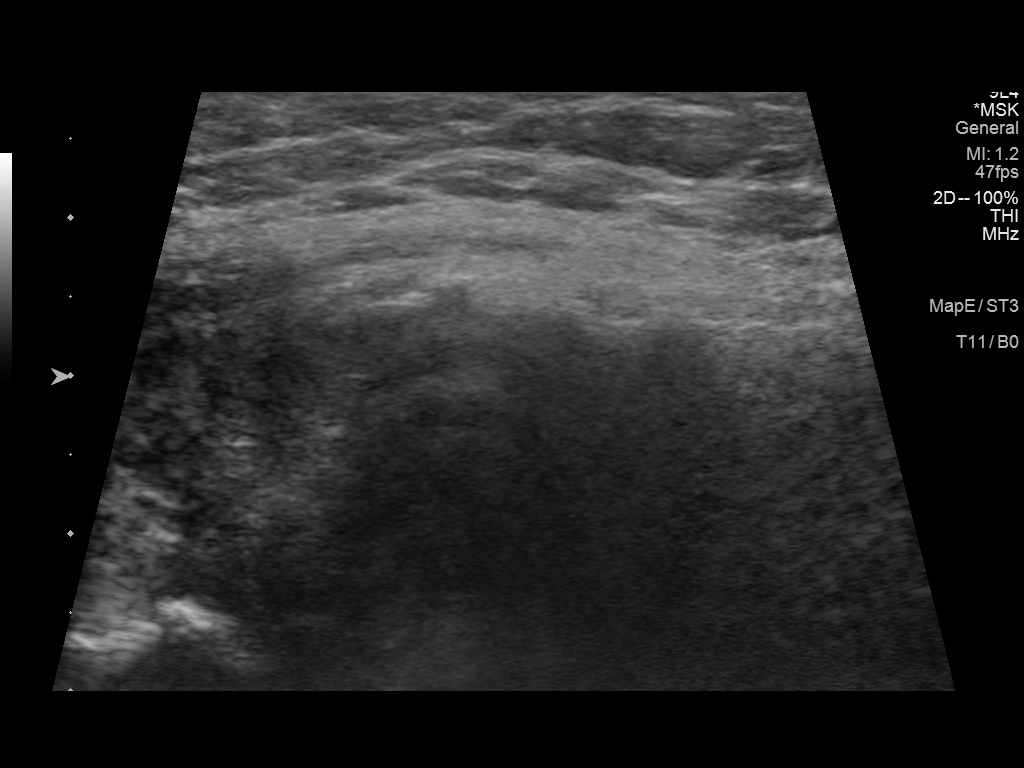
[im 3/7]
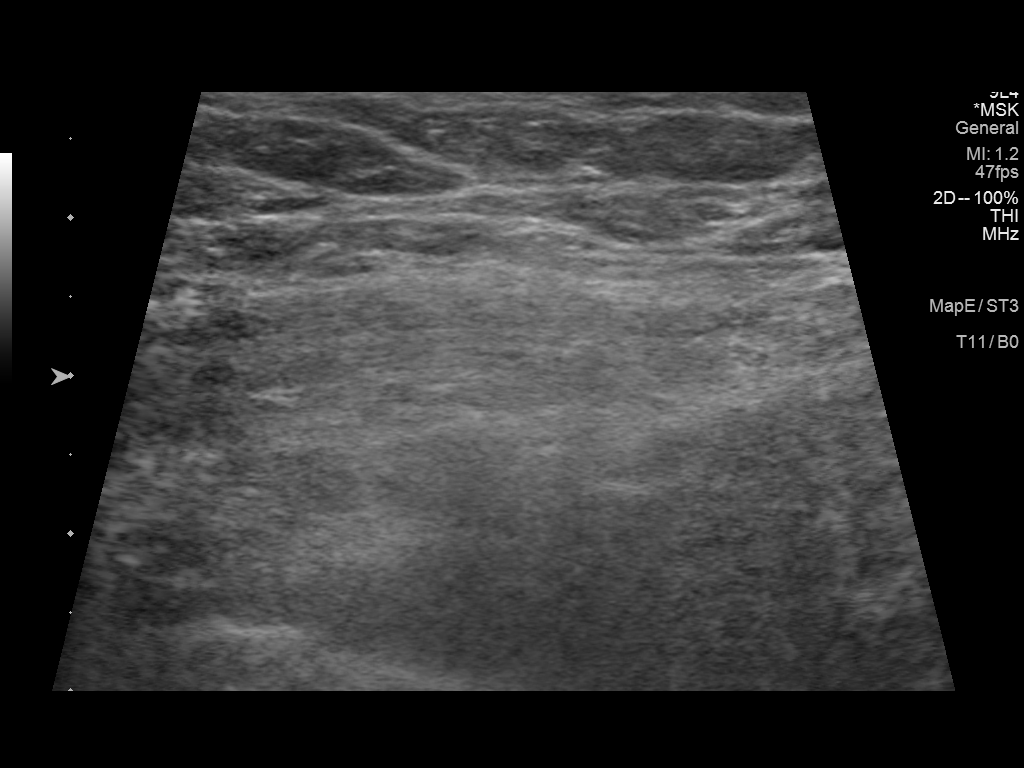
[im 4/7]
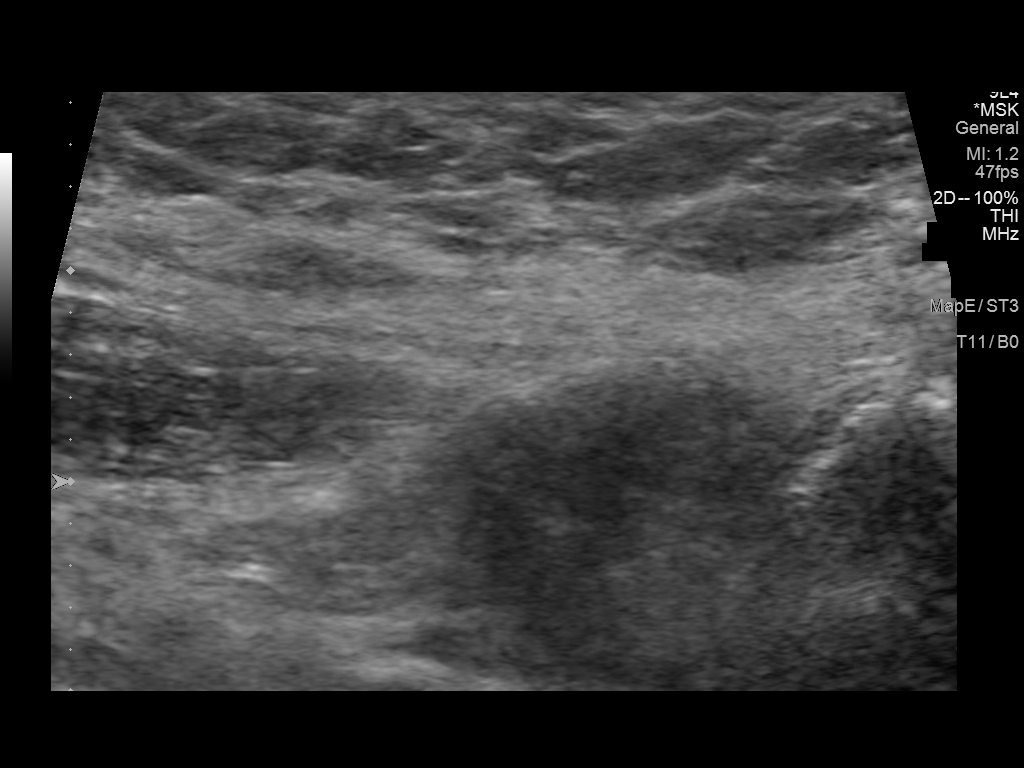
[im 5/7]
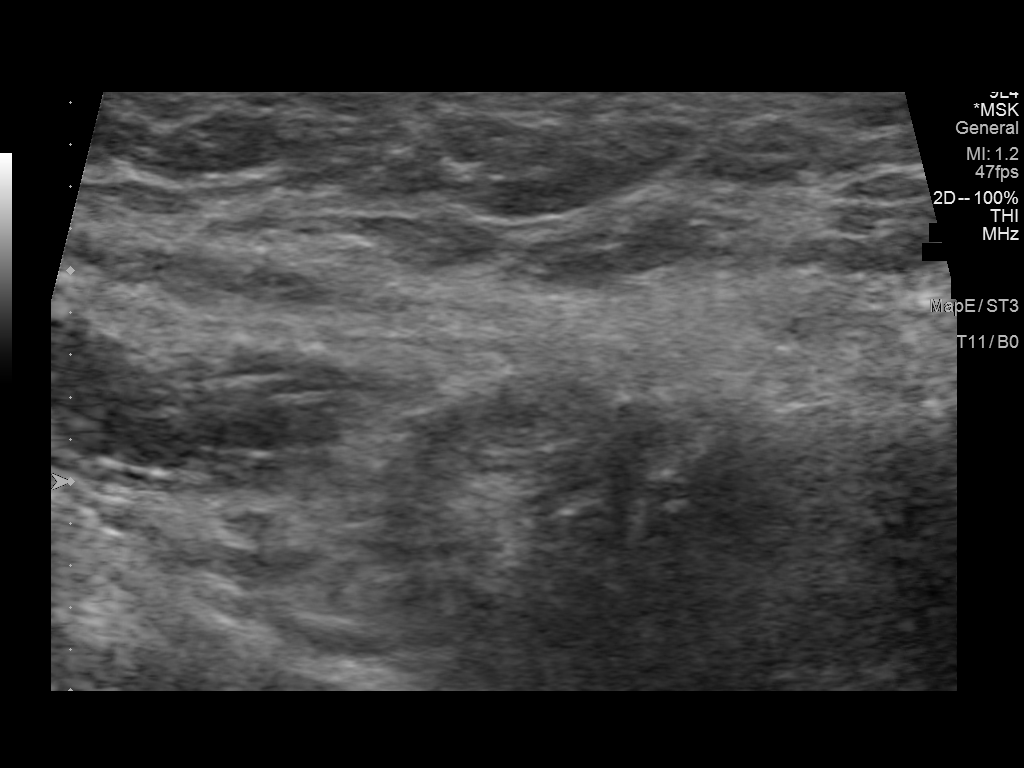
[im 6/7]
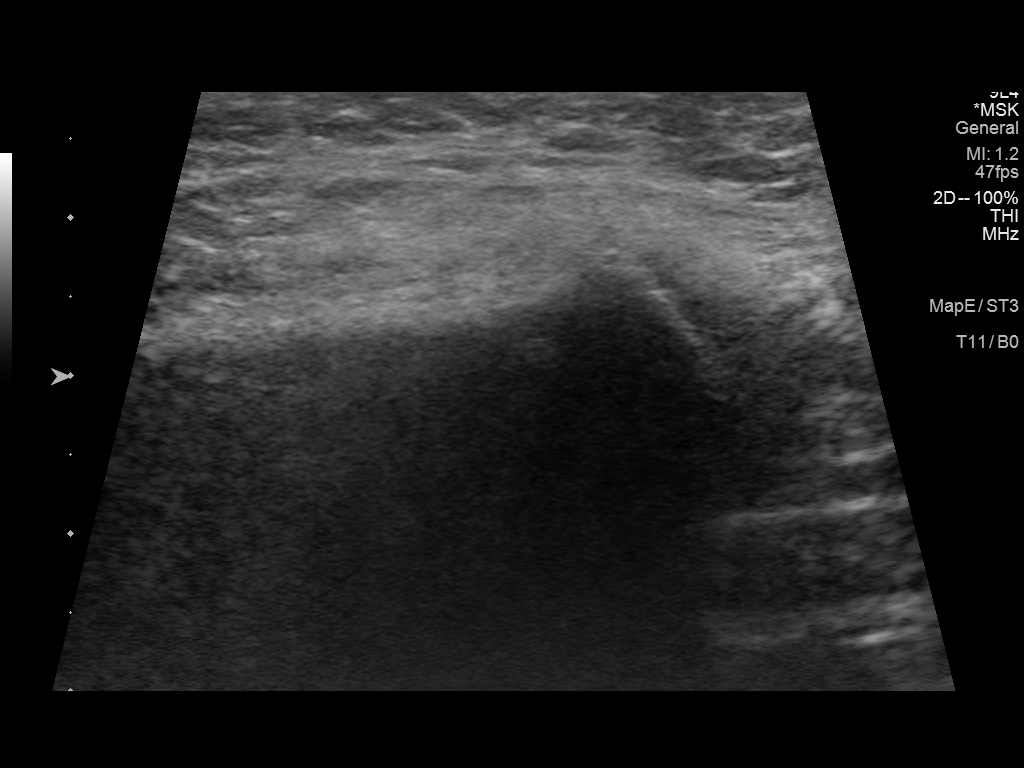
[im 7/7]
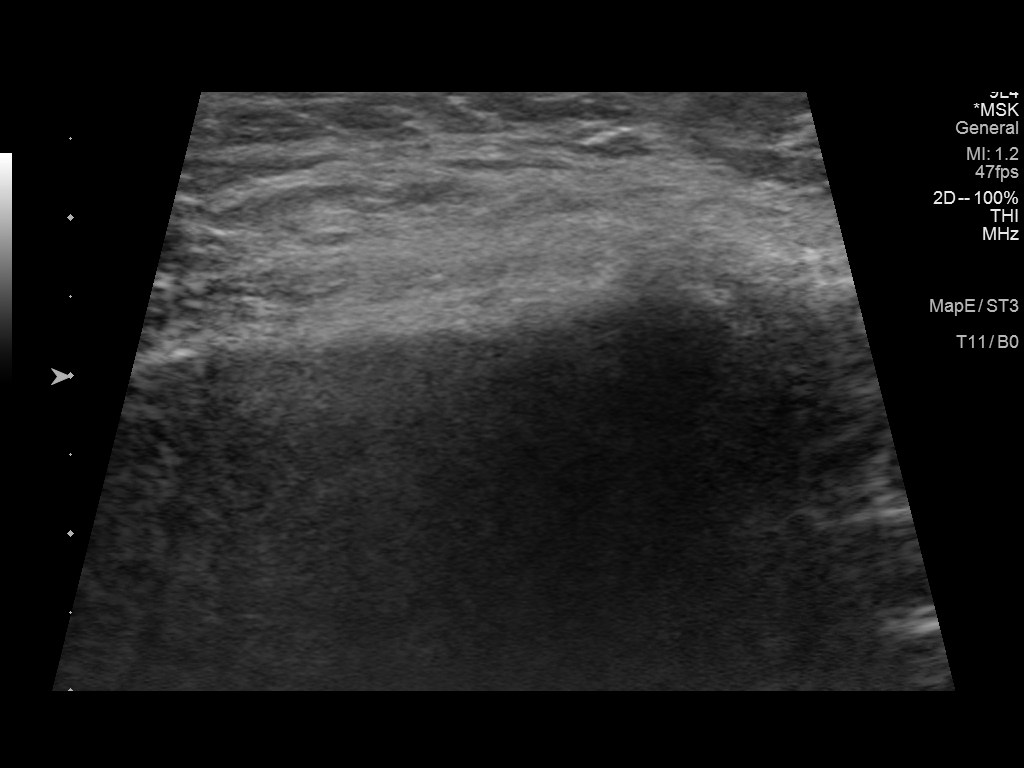

[7 of 7 positions shown; findings below may reference images not displayed]

FINDINGS: Sonographic interrogation of the right supraclavicular fossa
demonstrates normal subcutaneous adipose tissue. No lymphadenopathy
or focal mass lesion.
IMPRESSION: Negative sonographic survey of the region of clinical concern.

## 2023-06-30 ENCOUNTER — Other Ambulatory Visit (HOSPITAL_COMMUNITY): Payer: Self-pay

## 2023-07-01 ENCOUNTER — Other Ambulatory Visit (HOSPITAL_COMMUNITY): Payer: Self-pay

## 2023-08-20 ENCOUNTER — Other Ambulatory Visit (HOSPITAL_COMMUNITY): Payer: Self-pay

## 2023-08-31 ENCOUNTER — Other Ambulatory Visit (HOSPITAL_COMMUNITY): Payer: Self-pay

## 2023-09-21 ENCOUNTER — Other Ambulatory Visit (HOSPITAL_COMMUNITY): Payer: Self-pay

## 2023-12-02 ENCOUNTER — Other Ambulatory Visit (HOSPITAL_COMMUNITY): Payer: Self-pay

## 2023-12-06 ENCOUNTER — Other Ambulatory Visit (HOSPITAL_COMMUNITY): Payer: Self-pay

## 2024-01-03 ENCOUNTER — Other Ambulatory Visit (HOSPITAL_COMMUNITY): Payer: Self-pay

## 2024-01-03 MED ORDER — ZEPBOUND 12.5 MG/0.5ML ~~LOC~~ SOAJ
12.5000 mg | SUBCUTANEOUS | 3 refills | Status: AC
  Filled 2024-01-03: qty 2, 28d supply, fill #0
  Filled 2024-02-21: qty 2, 28d supply, fill #1
  Filled 2024-04-05: qty 2, 28d supply, fill #2
  Filled 2024-05-10: qty 2, 28d supply, fill #3

## 2024-02-08 ENCOUNTER — Other Ambulatory Visit (HOSPITAL_COMMUNITY): Payer: Self-pay

## 2024-02-08 MED ORDER — ZEPBOUND 12.5 MG/0.5ML ~~LOC~~ SOAJ
12.5000 mg | SUBCUTANEOUS | 11 refills | Status: AC
  Filled 2024-02-08: qty 2, 28d supply, fill #0
